# Patient Record
Sex: Female | Born: 1950 | Race: White | Hispanic: No | Marital: Married | Smoking: Never smoker
Health system: Southern US, Community
[De-identification: ages and names within clinical notes are randomized; demographics above are authoritative.]

## PROBLEM LIST (undated history)

## (undated) DIAGNOSIS — F988 Other specified behavioral and emotional disorders with onset usually occurring in childhood and adolescence: Secondary | ICD-10-CM

## (undated) DIAGNOSIS — G4730 Sleep apnea, unspecified: Secondary | ICD-10-CM

## (undated) DIAGNOSIS — M5030 Other cervical disc degeneration, unspecified cervical region: Secondary | ICD-10-CM

## (undated) DIAGNOSIS — R0600 Dyspnea, unspecified: Secondary | ICD-10-CM

## (undated) DIAGNOSIS — Z973 Presence of spectacles and contact lenses: Secondary | ICD-10-CM

## (undated) DIAGNOSIS — F32A Depression, unspecified: Secondary | ICD-10-CM

## (undated) DIAGNOSIS — M1990 Unspecified osteoarthritis, unspecified site: Secondary | ICD-10-CM

## (undated) DIAGNOSIS — F419 Anxiety disorder, unspecified: Secondary | ICD-10-CM

## (undated) DIAGNOSIS — E785 Hyperlipidemia, unspecified: Secondary | ICD-10-CM

## (undated) DIAGNOSIS — G4700 Insomnia, unspecified: Secondary | ICD-10-CM

## (undated) DIAGNOSIS — I10 Essential (primary) hypertension: Secondary | ICD-10-CM

---

## 2005-01-18 ENCOUNTER — Ambulatory Visit: Payer: Self-pay | Admitting: Family Medicine

## 2005-08-17 ENCOUNTER — Ambulatory Visit: Payer: Self-pay | Admitting: Unknown Physician Specialty

## 2006-04-08 ENCOUNTER — Ambulatory Visit: Payer: Self-pay | Admitting: Unknown Physician Specialty

## 2007-06-09 ENCOUNTER — Ambulatory Visit: Payer: Self-pay | Admitting: Unknown Physician Specialty

## 2010-04-21 ENCOUNTER — Ambulatory Visit: Payer: Self-pay | Admitting: Internal Medicine

## 2010-05-13 ENCOUNTER — Ambulatory Visit: Payer: Self-pay | Admitting: Unknown Physician Specialty

## 2010-05-20 ENCOUNTER — Ambulatory Visit: Payer: Self-pay | Admitting: Unknown Physician Specialty

## 2012-08-24 ENCOUNTER — Ambulatory Visit: Payer: Self-pay | Admitting: Unknown Physician Specialty

## 2012-09-25 ENCOUNTER — Ambulatory Visit: Payer: Self-pay | Admitting: Unknown Physician Specialty

## 2012-10-31 ENCOUNTER — Ambulatory Visit: Payer: Self-pay | Admitting: Anesthesiology

## 2012-11-07 ENCOUNTER — Ambulatory Visit: Payer: Self-pay | Admitting: Anesthesiology

## 2012-11-28 ENCOUNTER — Ambulatory Visit: Payer: Self-pay | Admitting: Anesthesiology

## 2012-11-30 ENCOUNTER — Ambulatory Visit: Payer: Self-pay | Admitting: Anesthesiology

## 2013-01-30 ENCOUNTER — Ambulatory Visit: Payer: Self-pay | Admitting: Anesthesiology

## 2013-02-13 ENCOUNTER — Ambulatory Visit: Payer: Self-pay | Admitting: Anesthesiology

## 2013-06-27 ENCOUNTER — Ambulatory Visit: Payer: Self-pay | Admitting: Neurosurgery

## 2014-05-03 NOTE — H&P (Signed)
PATIENT NAME:  Kristen Shannon, Kristen Shannon MR#:  [id number] DATE OF BIRTH:  1950/10/14  DATE OF ADMISSION:  10/31/2012  CHIEF COMPLAINT: Neck pain and left shoulder pain with hand numbness and tingling.   PROCEDURE: None.   HISTORY OF PRESENT ILLNESS: Kristen Shannon is a pleasant 65 year old and female with a long-standing history of left upper extremity numbness and tingling, neck and shoulder pain. She is reporting a condition that has gradually intensified to the point where she sought medical attention. She has had a previous MRI that shows multilevel degenerative disk disease most notably at C4-5 and C5-6 levels. She has some mild nerve root impingement with some thecal course impingement as well, and subsequent symptoms affecting the left upper extremity. She describes a gnawing, aching pain in the left posterior shoulder which radiates into her neck and runs down her arms with associated numbness and tingling in all fingers. She has not developed any significant hand grasp weakness or upper extremity weakness. No problems with bowel or bladder dysfunction are noted. No problems with lower extremity strength or function are noted.   MEDICATIONS:  Her medications include clotrimazole, duloxetine, fluticasone and multivitamin.   PAST MEDICAL HISTORY:  She also has a medical history of osteoarthritis, depression, insomnia, ADD, adenomatous polyps and degenerative disk disease of the cervical region.   PAST SURGICAL HISTORY:  She has had a previous C-section.    SOCIAL HISTORY: She is a former smoker of 3 packs per day, 30 pack-years. She uses occasional alcohol.   PHYSICAL EXAMINATION:  GENERAL:  Physical examination reveals a pleasant white female in no acute distress. She is alert and oriented x 3, cooperative and compliant.  EYES:  Pupils are equally round and reactive to light. Extraocular muscles intact.  HEART: Regular rate and rhythm.  LUNGS: Clear to auscultation.  NECK:  Inspection of the neck  reveals good strength at the atlantooccipital joint with forward flexion, extension and lateral rotation. She has diminished range of motion that is significant in nature. She has difficulty going down to 30 degrees with lateral abduction toward the left shoulder or right shoulder. She has good hand grasp strength. Flexion and extension of the wrist, biceps and shoulder are intact. She has good range of motion at the glenohumeral joints.   ASSESSMENT: Cervical degenerative disk disease with radicular symptoms in the C5-6 distribution left side.   PLAN: 1.  I have talked to her about a series of cervical epidural steroid injections to see if we get her pain under control. The risks and benefits of the procedure have been reviewed with her in full detail. We are going to give her some information on this today and have her return to the clinic at the next available date for her first cervical epidural steroid injection.  2.  She can continue following up with Dr. Gerald Stabs Cruzan for physical therapy; however, has failed to gain any significant improvement with this as of this point.  3.  Continue current medications.    ____________________________ Alvina Filbert Andree Elk, MD jga:cs D: 11/01/2012 14:58:00 ET T: 11/01/2012 15:15:13 ET JOB#: [id number]  cc: Alvina Filbert. Andree Elk, MD, <Dictator> Alvina Filbert ADAMS MD ELECTRONICALLY SIGNED 11/07/2012 8:04

## 2014-05-15 ENCOUNTER — Other Ambulatory Visit: Payer: Self-pay | Admitting: Unknown Physician Specialty

## 2014-05-15 DIAGNOSIS — Z1231 Encounter for screening mammogram for malignant neoplasm of breast: Secondary | ICD-10-CM

## 2014-06-11 ENCOUNTER — Ambulatory Visit: Payer: BLUE CROSS/BLUE SHIELD | Attending: Unknown Physician Specialty

## 2015-06-18 ENCOUNTER — Emergency Department
Admission: EM | Admit: 2015-06-18 | Discharge: 2015-06-18 | Disposition: A | Discharge status: Home / Self Care | Payer: BLUE CROSS/BLUE SHIELD | Attending: Emergency Medicine | Admitting: Emergency Medicine

## 2015-06-18 ENCOUNTER — Emergency Department: Payer: BLUE CROSS/BLUE SHIELD

## 2015-06-18 DIAGNOSIS — M25552 Pain in left hip: Secondary | ICD-10-CM | POA: Insufficient documentation

## 2015-06-18 HISTORY — DX: Anxiety disorder, unspecified: F41.9

## 2015-06-18 MED ORDER — MELOXICAM 15 MG PO TABS: 15.0000 mg | ORAL_TABLET | Freq: Every day | ORAL | Status: DC

## 2015-06-18 NOTE — Discharge Instructions (Signed)

## 2015-06-18 NOTE — ED Notes (Signed)
Pt arrives to ER via POV c/o left hip pain X "months". Pt states she was outside doing yardwork today and felt a pop to left hip, increased pain since. Pt states limp at baseline from broken toe on left foot. Pt alert and oriented X4, active, cooperative, pt in NAD. RR even and unlabored, color WNL.  Pt ambulatory.

## 2015-06-18 NOTE — ED Provider Notes (Signed)
[facility] Emergency Department Provider Note  ____________________________________________  Time seen: Approximately 4:05 PM  I have reviewed the triage vital signs and the nursing notes.   HISTORY  Chief Complaint Hip Pain    HPI Kristen Shannon is a 65 y.o. female who presents to emergency room complaining of left hip pain 2 months. Patient states that she has had intermittent left hip pain but no known injury for the past 2 months. Patient states that today she was doing yard work, felt a pop, and has had increased pain since this time. Patient denies falling or injuring this hip in any manner. Patient states that she called her primary care provider today to discuss symptoms and was advised to present to the emergency department. Patient states the pain is in the lateral aspect of the left hip. It is worse with movement. Patient denies any radiation to back, groin, or lower leg. No other complaints or symptoms.   Past Medical History  Diagnosis Date  . Anxiety     There are no active problems to display for this patient.   History reviewed. No pertinent past surgical history.  Current Outpatient Rx  Name  Route  Sig  Dispense  Refill  . meloxicam (MOBIC) 15 MG tablet   Oral   Take 1 tablet (15 mg total) by mouth daily.   30 tablet   0     Allergies Review of patient's allergies indicates no known allergies.  No family history on file.  Social History Social History  Substance Use Topics  . Smoking status: Never Smoker   . Smokeless tobacco: None  . Alcohol Use: Yes     Review of Systems  Constitutional: No fever/chills Cardiovascular: no chest pain. Respiratory: no cough. No SOB. Gastrointestinal: No abdominal pain.  No nausea, no vomiting.  No diarrhea.  No constipation. Genitourinary: Negative for dysuria. No hematuria Musculoskeletal: Positive for left hip pain. Skin: Negative for rash, abrasions, lacerations,  ecchymosis. Neurological: Negative for headaches, focal weakness or numbness. 10-point ROS otherwise negative.  ____________________________________________   PHYSICAL EXAM:  VITAL SIGNS: ED Triage Vitals  Enc Vitals Group     BP 06/18/15 1546 156/72 mmHg     Pulse Rate 06/18/15 1546 88     Resp --      Temp 06/18/15 1546 98 F (36.7 C)     Temp Source 06/18/15 1546 Oral     SpO2 06/18/15 1546 100 %     Weight 06/18/15 1546 174 lb (78.926 kg)     Height 06/18/15 1546 5\' 6"  (1.676 m)     Head Cir --      Peak Flow --      Pain Score 06/18/15 1546 10     Pain Loc --      Pain Edu? --      Excl. in [place]? --      Constitutional: Alert and oriented. Well appearing and in no acute distress. Eyes: Conjunctivae are normal. PERRL. EOMI. Head: Atraumatic. Neck: No stridor.    Cardiovascular: Normal rate, regular rhythm. Normal S1 and S2.  Good peripheral circulation. Respiratory: Normal respiratory effort without tachypnea or retractions. Lungs CTAB. Good air entry to the bases with no decreased or absent breath sounds. Gastrointestinal: Bowel sounds 4 quadrants. Soft and nontender to palpation. No guarding or rigidity. No palpable masses. No distention. No CVA tenderness. Musculoskeletal: No deformity to left hip. Patient has good range of motion. Passive range of motion does elicit tenderness  with full flexion. Patient is not tender to palpation over greater trochanter. No palpable abnormality. Sensation and pulses intact distally. Neurologic:  Normal speech and language. No gross focal neurologic deficits are appreciated.  Skin:  Skin is warm, dry and intact. No rash noted. Psychiatric: Mood and affect are normal. Speech and behavior are normal. Patient exhibits appropriate insight and judgement.   ____________________________________________   LABS (all labs ordered are listed, but only abnormal results are displayed)  Labs Reviewed - No data to  display ____________________________________________  EKG   ____________________________________________  RADIOLOGY Diamantina Providence Jaella Weinert, personally viewed and evaluated these images (plain radiographs) as part of my medical decision making, as well as reviewing the written report by the radiologist.  Dg Hip Unilat With Pelvis 2-3 Views Left  06/18/2015  CLINICAL DATA:  65 year old female with left hip pain for 2 months with no specific injury. Initial encounter. EXAM: DG HIP (WITH OR WITHOUT PELVIS) 2-3V LEFT COMPARISON:  None. FINDINGS: Both femoral heads are normally located. Hip joint spaces appear relatively symmetric and normal for age. The dominant degenerative finding is mild acetabular spurring bilaterally. Pelvis intact. Sacral ala and SI joints appear normal. Proximal right femur appears intact. Proximal left femur intact. IMPRESSION: No acute osseous abnormality identified about the left hip or pelvis. Electronically Signed   By: Genevie Ann M.D.   On: 06/18/2015 16:27    ____________________________________________    PROCEDURES  Procedure(s) performed:       Medications - No data to display   ____________________________________________   INITIAL IMPRESSION / ASSESSMENT AND PLAN / ED COURSE  Pertinent labs & imaging results that were available during my care of the patient were reviewed by me and considered in my medical decision making (see chart for details).  Patient's diagnosis is consistent with left hip pain. X-rays reveal no acute osseous abnormality. Exam is greatly reassuring. Patient reports a "tightness/burning sensation over the musculature. It is not reproducible to palpation. Diagnosis is left hip pain with no specific underlying etiology. Patient's symptoms and location are consistent with either IT band syndrome or bursitis but exam does not reveal tenderness to palpation over either region. As such, patient will be given anti-inflammatories for  symptom control and advised to follow-up with orthopedics should symptoms persist past treatment with NSAIDs..  Patient is given ED precautions to return to the ED for any worsening or new symptoms.     ____________________________________________  FINAL CLINICAL IMPRESSION(S) / ED DIAGNOSES  Final diagnoses:  Hip pain, left      NEW MEDICATIONS STARTED DURING THIS VISIT:  Discharge Medication List as of 06/18/2015  5:41 PM    START taking these medications   Details  meloxicam (MOBIC) 15 MG tablet Take 1 tablet (15 mg total) by mouth daily., Starting 06/18/2015, Until Discontinued, Print            This chart was dictated using voice recognition software/Dragon. Despite best efforts to proofread, errors can occur which can change the meaning. Any change was purely unintentional.    Darletta Moll, PA-C 06/18/15 1839  Lisa Roca, MD 06/18/15 2248

## 2015-08-11 ENCOUNTER — Other Ambulatory Visit: Payer: Self-pay | Admitting: Unknown Physician Specialty

## 2015-08-11 DIAGNOSIS — N9489 Other specified conditions associated with female genital organs and menstrual cycle: Secondary | ICD-10-CM

## 2015-08-11 DIAGNOSIS — R9389 Abnormal findings on diagnostic imaging of other specified body structures: Secondary | ICD-10-CM

## 2015-08-21 ENCOUNTER — Ambulatory Visit
Admission: RE | Admit: 2015-08-21 | Discharge: 2015-08-21 | Disposition: A | Discharge status: Home / Self Care | Payer: BLUE CROSS/BLUE SHIELD | Source: Ambulatory Visit | Attending: Unknown Physician Specialty | Admitting: Unknown Physician Specialty

## 2015-08-21 ENCOUNTER — Other Ambulatory Visit
Admission: RE | Admit: 2015-08-21 | Discharge: 2015-08-21 | Disposition: A | Discharge status: Home / Self Care | Payer: BLUE CROSS/BLUE SHIELD | Source: Ambulatory Visit | Attending: Unknown Physician Specialty | Admitting: Unknown Physician Specialty

## 2015-08-21 DIAGNOSIS — R9389 Abnormal findings on diagnostic imaging of other specified body structures: Secondary | ICD-10-CM

## 2015-08-21 DIAGNOSIS — N9489 Other specified conditions associated with female genital organs and menstrual cycle: Secondary | ICD-10-CM

## 2015-08-21 DIAGNOSIS — Z01812 Encounter for preprocedural laboratory examination: Secondary | ICD-10-CM | POA: Diagnosis not present

## 2015-08-21 DIAGNOSIS — R938 Abnormal findings on diagnostic imaging of other specified body structures: Secondary | ICD-10-CM | POA: Diagnosis not present

## 2015-08-21 LAB — CREATININE, SERUM
CREATININE: 0.61 mg/dL (ref 0.44–1.00)
GFR calc Af Amer: >60 mL/min (ref >60)
GFR calc non Af Amer: >60 mL/min (ref >60)

## 2015-08-21 MED ORDER — GADOBENATE DIMEGLUMINE 529 MG/ML IV SOLN
16.0000 mL | Freq: Once | INTRAVENOUS | Status: AC | PRN
  Administered 2015-08-21: 16 mL via INTRAVENOUS

## 2015-10-27 ENCOUNTER — Other Ambulatory Visit: Payer: Self-pay | Admitting: Unknown Physician Specialty

## 2015-10-27 DIAGNOSIS — Z1231 Encounter for screening mammogram for malignant neoplasm of breast: Secondary | ICD-10-CM

## 2015-10-31 ENCOUNTER — Ambulatory Visit
Admission: RE | Admit: 2015-10-31 | Discharge: 2015-10-31 | Disposition: A | Discharge status: Home / Self Care | Payer: BLUE CROSS/BLUE SHIELD | Source: Ambulatory Visit | Attending: Unknown Physician Specialty | Admitting: Unknown Physician Specialty

## 2015-10-31 DIAGNOSIS — Z1231 Encounter for screening mammogram for malignant neoplasm of breast: Secondary | ICD-10-CM | POA: Diagnosis not present

## 2017-06-05 IMAGING — MG MM DIGITAL SCREENING BILAT W/ CAD
5 series · 5 of 5 positions shown · non-contrast
Comparison: Previous exam(s).

CLINICAL DATA: Screening.

EXAM:
DIGITAL SCREENING BILATERAL MAMMOGRAM WITH CAD

[R CC]
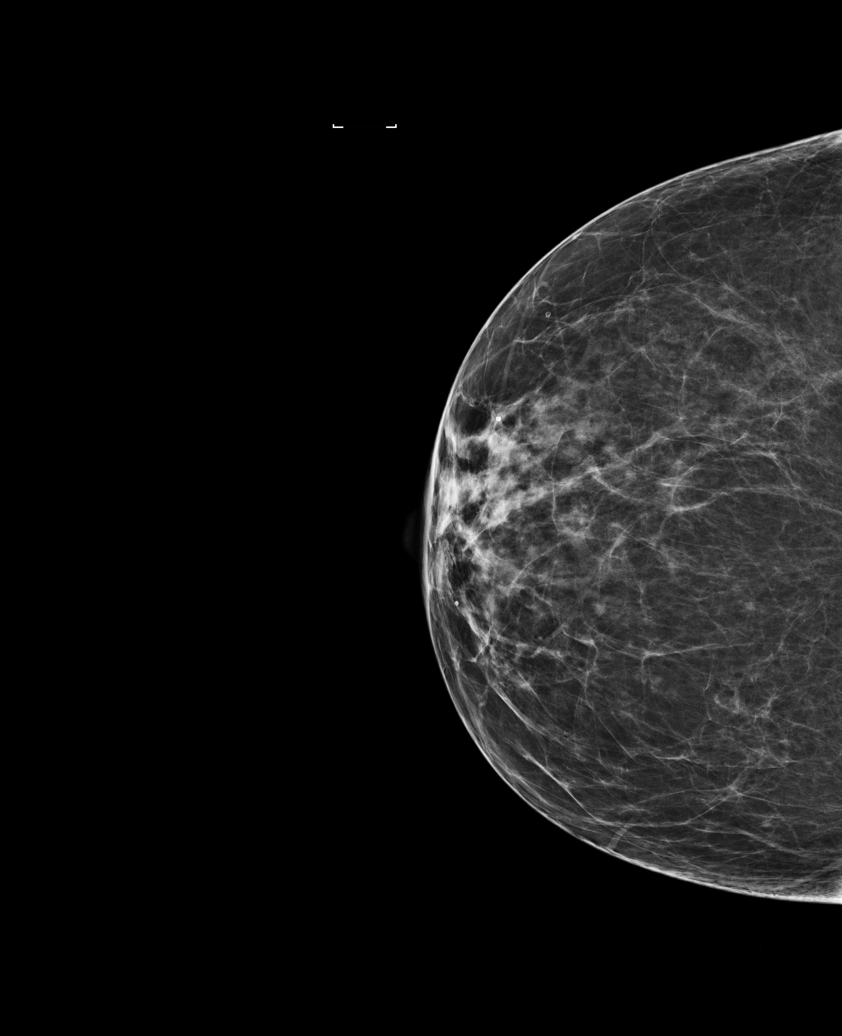

[L MLO (1 of 2)]
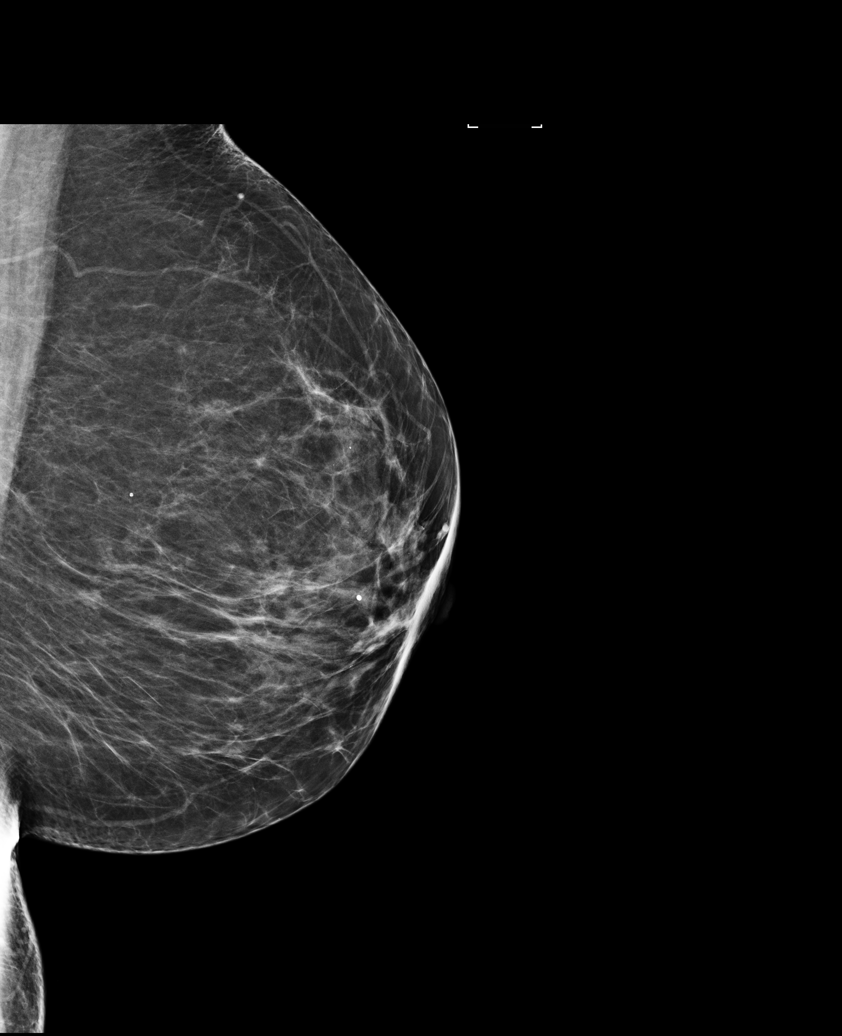

[L MLO (2 of 2)]
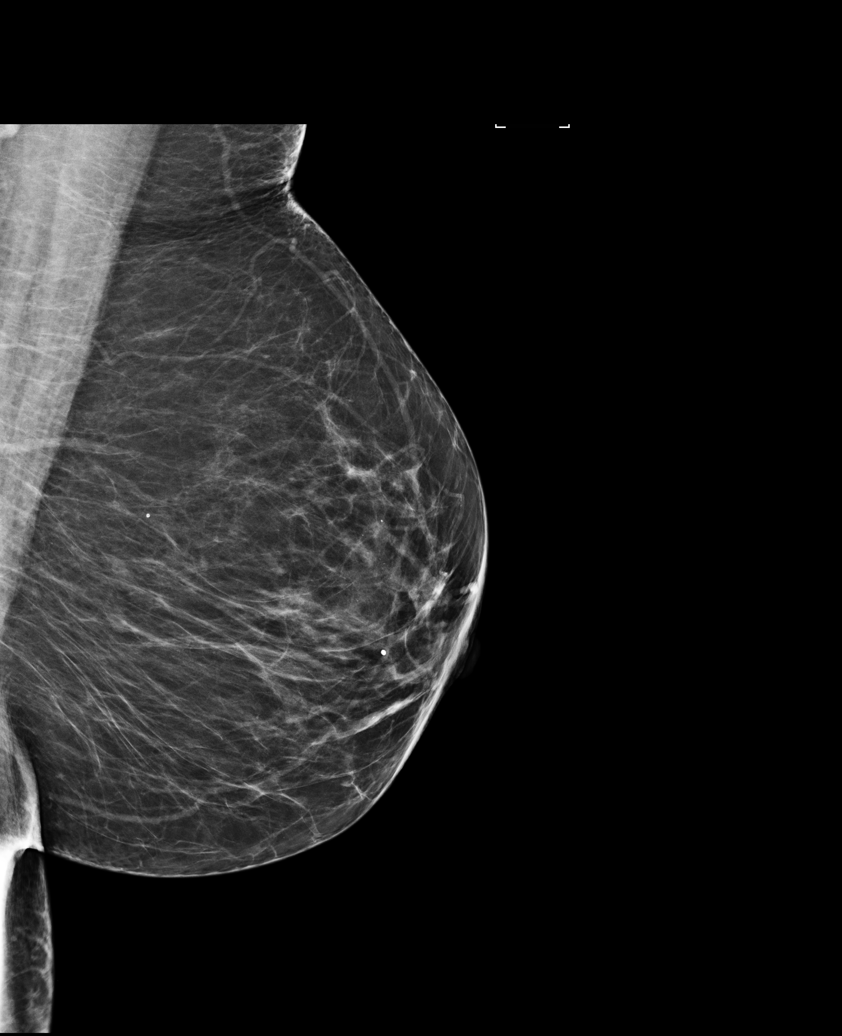

[R MLO]
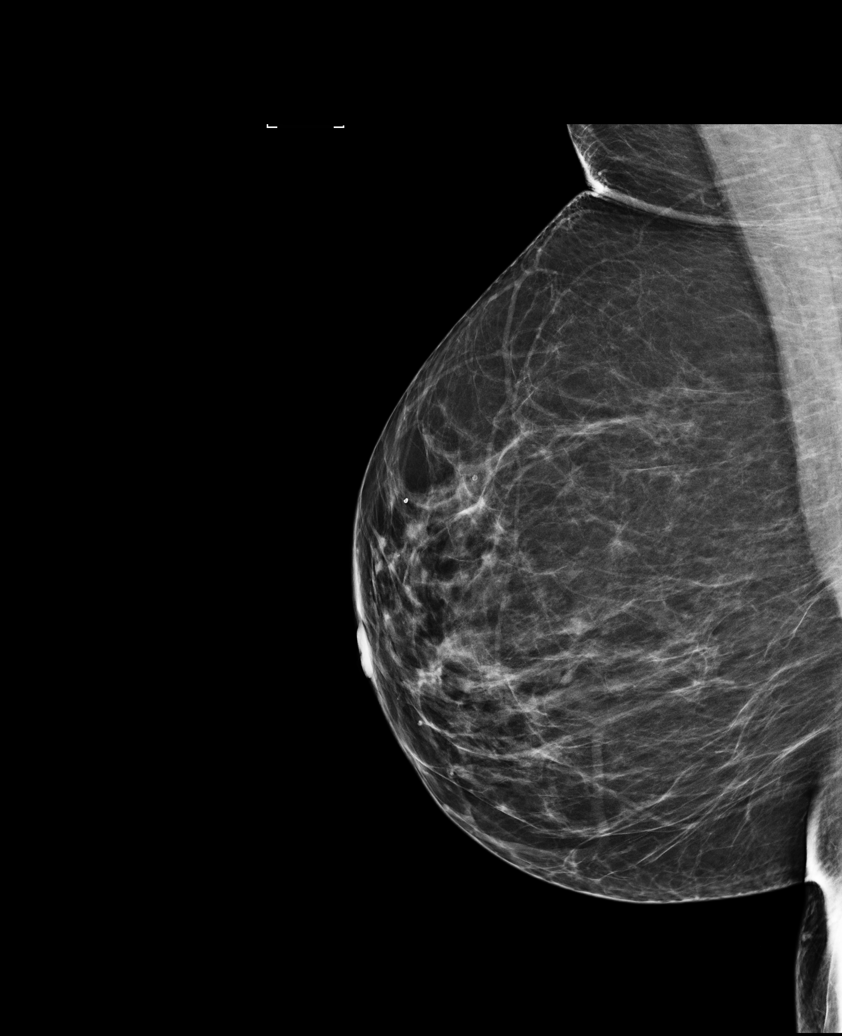

[L CC]
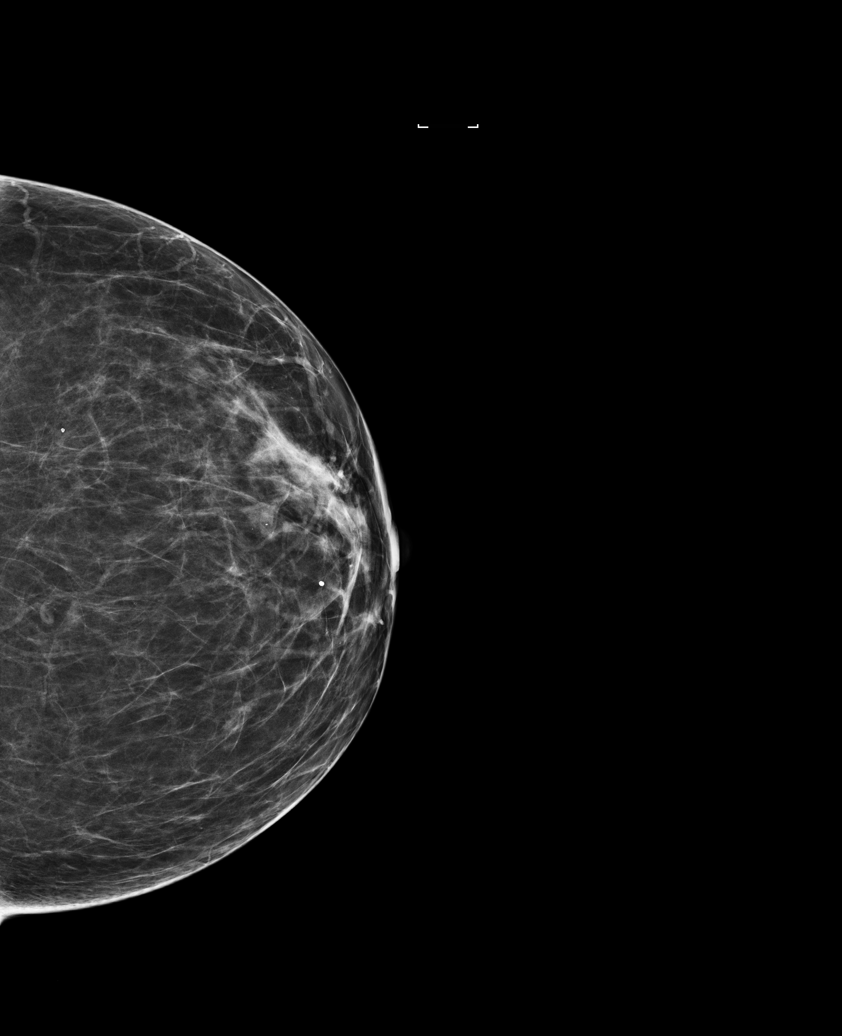

[5 of 5 positions shown; findings below may reference images not displayed]

ACR Breast Density Category b: There are scattered areas of
fibroglandular density.
FINDINGS: There are no findings suspicious for malignancy. Images were
processed with CAD.
IMPRESSION: No mammographic evidence of malignancy. A result letter of this
screening mammogram will be mailed directly to the patient.

RECOMMENDATION:
Screening mammogram in one year. (Code:AS-G-LCT)

BI-RADS CATEGORY  1: Negative.

## 2018-02-16 ENCOUNTER — Other Ambulatory Visit: Payer: Self-pay | Admitting: Physician Assistant

## 2018-02-16 DIAGNOSIS — M542 Cervicalgia: Secondary | ICD-10-CM

## 2018-02-16 DIAGNOSIS — Z981 Arthrodesis status: Secondary | ICD-10-CM

## 2018-02-17 ENCOUNTER — Other Ambulatory Visit: Payer: Self-pay | Admitting: Physician Assistant

## 2018-02-17 DIAGNOSIS — M542 Cervicalgia: Secondary | ICD-10-CM

## 2018-02-17 DIAGNOSIS — G5603 Carpal tunnel syndrome, bilateral upper limbs: Secondary | ICD-10-CM

## 2018-02-17 DIAGNOSIS — Z981 Arthrodesis status: Secondary | ICD-10-CM

## 2018-02-28 ENCOUNTER — Ambulatory Visit
Admission: RE | Admit: 2018-02-28 | Discharge: 2018-02-28 | Disposition: A | Discharge status: Home / Self Care | Payer: BLUE CROSS/BLUE SHIELD | Source: Ambulatory Visit | Attending: Physician Assistant | Admitting: Physician Assistant

## 2018-02-28 DIAGNOSIS — M542 Cervicalgia: Secondary | ICD-10-CM | POA: Diagnosis present

## 2018-02-28 DIAGNOSIS — Z981 Arthrodesis status: Secondary | ICD-10-CM | POA: Diagnosis present

## 2018-02-28 DIAGNOSIS — G5603 Carpal tunnel syndrome, bilateral upper limbs: Secondary | ICD-10-CM | POA: Diagnosis present

## 2018-07-04 ENCOUNTER — Other Ambulatory Visit: Payer: Self-pay | Admitting: Unknown Physician Specialty

## 2018-07-04 DIAGNOSIS — Z1231 Encounter for screening mammogram for malignant neoplasm of breast: Secondary | ICD-10-CM

## 2018-07-24 ENCOUNTER — Ambulatory Visit
Admission: RE | Admit: 2018-07-24 | Discharge: 2018-07-24 | Disposition: A | Discharge status: Home / Self Care | Payer: Medicare Other | Source: Ambulatory Visit | Attending: Unknown Physician Specialty | Admitting: Unknown Physician Specialty

## 2018-07-24 ENCOUNTER — Other Ambulatory Visit: Payer: Self-pay

## 2018-07-24 DIAGNOSIS — Z1231 Encounter for screening mammogram for malignant neoplasm of breast: Secondary | ICD-10-CM | POA: Diagnosis present

## 2018-07-26 ENCOUNTER — Other Ambulatory Visit: Payer: Self-pay | Admitting: Unknown Physician Specialty

## 2018-08-02 ENCOUNTER — Other Ambulatory Visit: Payer: Self-pay | Admitting: Unknown Physician Specialty

## 2018-08-02 DIAGNOSIS — N6489 Other specified disorders of breast: Secondary | ICD-10-CM

## 2018-08-02 DIAGNOSIS — N6320 Unspecified lump in the left breast, unspecified quadrant: Secondary | ICD-10-CM

## 2018-08-02 DIAGNOSIS — R928 Other abnormal and inconclusive findings on diagnostic imaging of breast: Secondary | ICD-10-CM

## 2018-08-09 ENCOUNTER — Other Ambulatory Visit: Payer: Medicare Other

## 2018-08-09 ENCOUNTER — Ambulatory Visit: Payer: Medicare Other

## 2018-08-16 ENCOUNTER — Other Ambulatory Visit: Payer: Medicare Other

## 2018-08-25 ENCOUNTER — Other Ambulatory Visit: Payer: Medicare Other

## 2018-08-25 ENCOUNTER — Ambulatory Visit: Payer: Medicare HMO

## 2018-09-08 ENCOUNTER — Ambulatory Visit
Admission: RE | Admit: 2018-09-08 | Discharge: 2018-09-08 | Disposition: A | Discharge status: Home / Self Care | Payer: Medicare HMO | Source: Ambulatory Visit | Attending: Unknown Physician Specialty | Admitting: Unknown Physician Specialty

## 2018-09-08 DIAGNOSIS — N6320 Unspecified lump in the left breast, unspecified quadrant: Secondary | ICD-10-CM | POA: Insufficient documentation

## 2018-09-08 DIAGNOSIS — N6489 Other specified disorders of breast: Secondary | ICD-10-CM

## 2018-09-08 DIAGNOSIS — R928 Other abnormal and inconclusive findings on diagnostic imaging of breast: Secondary | ICD-10-CM | POA: Diagnosis present

## 2018-09-14 ENCOUNTER — Other Ambulatory Visit: Payer: Self-pay | Admitting: Unknown Physician Specialty

## 2018-09-14 DIAGNOSIS — N6320 Unspecified lump in the left breast, unspecified quadrant: Secondary | ICD-10-CM

## 2018-09-14 DIAGNOSIS — N6310 Unspecified lump in the right breast, unspecified quadrant: Secondary | ICD-10-CM

## 2018-09-14 DIAGNOSIS — R928 Other abnormal and inconclusive findings on diagnostic imaging of breast: Secondary | ICD-10-CM

## 2018-10-05 ENCOUNTER — Other Ambulatory Visit: Payer: Self-pay | Admitting: Unknown Physician Specialty

## 2018-10-05 DIAGNOSIS — Z780 Asymptomatic menopausal state: Secondary | ICD-10-CM

## 2019-06-01 ENCOUNTER — Other Ambulatory Visit: Payer: Self-pay | Admitting: Cardiology

## 2020-05-21 ENCOUNTER — Other Ambulatory Visit: Payer: Self-pay | Admitting: Podiatry

## 2020-05-21 DIAGNOSIS — M7741 Metatarsalgia, right foot: Secondary | ICD-10-CM

## 2020-05-21 DIAGNOSIS — G5761 Lesion of plantar nerve, right lower limb: Secondary | ICD-10-CM

## 2020-06-03 ENCOUNTER — Other Ambulatory Visit: Payer: Self-pay

## 2020-06-03 ENCOUNTER — Ambulatory Visit
Admission: RE | Admit: 2020-06-03 | Discharge: 2020-06-03 | Disposition: A | Discharge status: Home / Self Care | Payer: Medicare HMO | Source: Ambulatory Visit | Attending: Podiatry | Admitting: Podiatry

## 2020-06-03 DIAGNOSIS — G5761 Lesion of plantar nerve, right lower limb: Secondary | ICD-10-CM | POA: Insufficient documentation

## 2020-06-03 DIAGNOSIS — M7741 Metatarsalgia, right foot: Secondary | ICD-10-CM

## 2020-08-07 ENCOUNTER — Other Ambulatory Visit: Payer: Self-pay | Admitting: Podiatry

## 2020-08-11 ENCOUNTER — Encounter: Payer: Self-pay | Admitting: Podiatry

## 2020-08-18 NOTE — Discharge Instructions (Signed)
[facility]  POST OPERATIVE INSTRUCTIONS FOR DR. FOWLER AND [facility] PODIATRY DEPART[facility]   Take your medication as prescribed.  Pain medication should be taken only as needed.  Keep the dressing clean, dry and intact.  Keep your foot elevated above the heart level for the first 48 hours.  Walking to the bathroom and brief periods of walking are acceptable, unless we have instructed you to be non-weight bearing.  Always wear your post-op shoe when walking.  Always use your crutches if you are to be non-weight bearing.  Do not take a shower. Baths are permissible as long as the foot is kept out of the water.   Every hour you are awake:  Bend your knee 15 times. Flex foot 15 times Massage calf 15 times  Call [facility] ([phone]) if any of the following problems occur: You develop a temperature or fever. The bandage becomes saturated with blood. Medication does not stop your pain. Injury of the foot occurs. Any symptoms of infection including redness, odor, or red streaks running from wound. 

## 2020-08-19 NOTE — Anesthesia Preprocedure Evaluation (Addendum)
Anesthesia Evaluation  Patient identified by MRN, date of birth, ID band Patient awake    Reviewed: Allergy & Precautions, NPO status , Patient's Chart, lab work & pertinent test results  History of Anesthesia Complications Negative for: history of anesthetic complications  Airway Mallampati: III   Neck ROM: Full    Dental   Lower right bridge:   Pulmonary sleep apnea , former smoker (quit 31 years ago),    Pulmonary exam normal breath sounds clear to auscultation       Cardiovascular hypertension, Normal cardiovascular exam Rhythm:Regular Rate:Normal     Neuro/Psych PSYCHIATRIC DISORDERS (ADHD) Anxiety Depression negative neurological ROS     GI/Hepatic negative GI ROS,   Endo/Other  Obesity   Renal/GU negative Renal ROS     Musculoskeletal  (+) Arthritis ,   Abdominal   Peds  Hematology negative hematology ROS (+)   Anesthesia Other Findings   Reproductive/Obstetrics                            Anesthesia Physical Anesthesia Plan  ASA: 2  Anesthesia Plan: General   Post-op Pain Management:    Induction: Intravenous  PONV Risk Score and Plan: 3 and Ondansetron, Dexamethasone and Treatment may vary due to age or medical condition  Airway Management Planned: LMA  Additional Equipment:   Intra-op Plan:   Post-operative Plan: Extubation in OR  Informed Consent: I have reviewed the patients History and Physical, chart, labs and discussed the procedure including the risks, benefits and alternatives for the proposed anesthesia with the patient or authorized representative who has indicated his/her understanding and acceptance.       Plan Discussed with: CRNA  Anesthesia Plan Comments:        Anesthesia Quick Evaluation

## 2020-08-20 ENCOUNTER — Other Ambulatory Visit: Payer: Self-pay

## 2020-08-20 ENCOUNTER — Encounter: Payer: Self-pay | Admitting: Podiatry

## 2020-08-20 ENCOUNTER — Encounter
Admission: RE | Disposition: A | Discharge status: Home / Self Care | Payer: Self-pay | Source: Home / Self Care | Attending: Podiatry

## 2020-08-20 ENCOUNTER — Ambulatory Visit: Payer: Medicare HMO | Admitting: Anesthesiology

## 2020-08-20 ENCOUNTER — Ambulatory Visit
Admission: RE | Admit: 2020-08-20 | Discharge: 2020-08-20 | Disposition: A | Discharge status: Home / Self Care | Payer: Medicare HMO | Attending: Podiatry | Admitting: Podiatry

## 2020-08-20 DIAGNOSIS — Z79899 Other long term (current) drug therapy: Secondary | ICD-10-CM | POA: Diagnosis not present

## 2020-08-20 DIAGNOSIS — Z87891 Personal history of nicotine dependence: Secondary | ICD-10-CM | POA: Insufficient documentation

## 2020-08-20 DIAGNOSIS — Z7982 Long term (current) use of aspirin: Secondary | ICD-10-CM | POA: Insufficient documentation

## 2020-08-20 DIAGNOSIS — M65871 Other synovitis and tenosynovitis, right ankle and foot: Secondary | ICD-10-CM | POA: Diagnosis not present

## 2020-08-20 DIAGNOSIS — G5761 Lesion of plantar nerve, right lower limb: Secondary | ICD-10-CM | POA: Diagnosis present

## 2020-08-20 HISTORY — DX: Sleep apnea, unspecified: G47.30

## 2020-08-20 HISTORY — DX: Insomnia, unspecified: G47.00

## 2020-08-20 HISTORY — DX: Other cervical disc degeneration, unspecified cervical region: M50.30

## 2020-08-20 HISTORY — DX: Depression, unspecified: F32.A

## 2020-08-20 HISTORY — DX: Dyspnea, unspecified: R06.00

## 2020-08-20 HISTORY — DX: Essential (primary) hypertension: I10

## 2020-08-20 HISTORY — DX: Unspecified osteoarthritis, unspecified site: M19.90

## 2020-08-20 HISTORY — DX: Other specified behavioral and emotional disorders with onset usually occurring in childhood and adolescence: F98.8

## 2020-08-20 HISTORY — PX: EXCISION MORTON'S NEUROMA: SHX5013

## 2020-08-20 SURGERY — EXCISION, MORTON'S NEUROMA
Anesthesia: General | Site: Foot | Laterality: Right

## 2020-08-20 MED ORDER — SODIUM CHLORIDE 0.9 % IR SOLN
Status: DC | PRN
  Administered 2020-08-20: 500 mL

## 2020-08-20 MED ORDER — LIDOCAINE HCL (CARDIAC) PF 100 MG/5ML IV SOSY
PREFILLED_SYRINGE | INTRAVENOUS | Status: DC | PRN
  Administered 2020-08-20: 50 mg via INTRATRACHEAL

## 2020-08-20 MED ORDER — CEFAZOLIN SODIUM-DEXTROSE 2-4 GM/100ML-% IV SOLN
2.0000 g | INTRAVENOUS | Status: AC
  Administered 2020-08-20: 2 g via INTRAVENOUS

## 2020-08-20 MED ORDER — PROPOFOL 10 MG/ML IV BOLUS
INTRAVENOUS | Status: DC | PRN
  Administered 2020-08-20: 120 mg via INTRAVENOUS

## 2020-08-20 MED ORDER — ACETAMINOPHEN 10 MG/ML IV SOLN: 1000.0000 mg | Freq: Once | INTRAVENOUS | Status: DC | PRN

## 2020-08-20 MED ORDER — ONDANSETRON HCL 4 MG/2ML IJ SOLN: 4.0000 mg | Freq: Once | INTRAMUSCULAR | Status: DC | PRN

## 2020-08-20 MED ORDER — LACTATED RINGERS IV SOLN: INTRAVENOUS | Status: DC

## 2020-08-20 MED ORDER — ALBUTEROL SULFATE HFA 108 (90 BASE) MCG/ACT IN AERS
INHALATION_SPRAY | RESPIRATORY_TRACT | Status: DC | PRN
  Administered 2020-08-20: 4 via RESPIRATORY_TRACT

## 2020-08-20 MED ORDER — EPHEDRINE SULFATE 50 MG/ML IJ SOLN
INTRAMUSCULAR | Status: DC | PRN
  Administered 2020-08-20 (×3): 10 mg via INTRAVENOUS

## 2020-08-20 MED ORDER — ONDANSETRON HCL 4 MG/2ML IJ SOLN: 4.0000 mg | Freq: Four times a day (QID) | INTRAMUSCULAR | Status: DC | PRN

## 2020-08-20 MED ORDER — GLYCOPYRROLATE 0.2 MG/ML IJ SOLN
INTRAMUSCULAR | Status: DC | PRN
  Administered 2020-08-20: .1 mg via INTRAVENOUS

## 2020-08-20 MED ORDER — OXYCODONE HCL 5 MG PO TABS: 5.0000 mg | ORAL_TABLET | Freq: Once | ORAL | Status: DC | PRN

## 2020-08-20 MED ORDER — OXYCODONE HCL 5 MG/5ML PO SOLN
5.0000 mg | Freq: Once | ORAL | Status: DC | PRN
Start: 2020-08-20 — End: 2020-08-20

## 2020-08-20 MED ORDER — METOCLOPRAMIDE HCL 5 MG/ML IJ SOLN: 5.0000 mg | Freq: Three times a day (TID) | INTRAMUSCULAR | Status: DC | PRN

## 2020-08-20 MED ORDER — FENTANYL CITRATE PF 50 MCG/ML IJ SOSY: 25.0000 ug | PREFILLED_SYRINGE | INTRAMUSCULAR | Status: DC | PRN

## 2020-08-20 MED ORDER — MIDAZOLAM HCL 5 MG/5ML IJ SOLN
INTRAMUSCULAR | Status: DC | PRN
  Administered 2020-08-20: 2 mg via INTRAVENOUS

## 2020-08-20 MED ORDER — ONDANSETRON HCL 4 MG PO TABS: 4.0000 mg | ORAL_TABLET | Freq: Four times a day (QID) | ORAL | Status: DC | PRN

## 2020-08-20 MED ORDER — LIDOCAINE-EPINEPHRINE 1 %-1:100000 IJ SOLN
INTRAMUSCULAR | Status: DC | PRN
  Administered 2020-08-20: 4 mL via INTRADERMAL

## 2020-08-20 MED ORDER — POVIDONE-IODINE 7.5 % EX SOLN: Freq: Once | CUTANEOUS | Status: AC

## 2020-08-20 MED ORDER — FENTANYL CITRATE (PF) 100 MCG/2ML IJ SOLN
INTRAMUSCULAR | Status: DC | PRN
  Administered 2020-08-20: 50 ug via INTRAVENOUS

## 2020-08-20 MED ORDER — DEXAMETHASONE SODIUM PHOSPHATE 4 MG/ML IJ SOLN
INTRAMUSCULAR | Status: DC | PRN
  Administered 2020-08-20: 4 mg via INTRAVENOUS

## 2020-08-20 MED ORDER — BUPIVACAINE HCL (PF) 0.5 % IJ SOLN
INTRAMUSCULAR | Status: DC | PRN
  Administered 2020-08-20: 4 mL

## 2020-08-20 MED ORDER — OXYCODONE-ACETAMINOPHEN 5-325 MG PO TABS: 1.0000 | ORAL_TABLET | Freq: Four times a day (QID) | ORAL | 0 refills | Status: AC | PRN

## 2020-08-20 MED ORDER — METOCLOPRAMIDE HCL 5 MG PO TABS: 5.0000 mg | ORAL_TABLET | Freq: Three times a day (TID) | ORAL | Status: DC | PRN

## 2020-08-20 SURGICAL SUPPLY — 25 items
BENZOIN TINCTURE PRP APPL 2/3 (GAUZE/BANDAGES/DRESSINGS) ×2 IMPLANT
CANISTER SUCT 1200ML W/VALVE (MISCELLANEOUS) ×2 IMPLANT
COVER LIGHT HANDLE UNIVERSAL (MISCELLANEOUS) ×2 IMPLANT
DURAPREP 26ML APPLICATOR (WOUND CARE) ×2 IMPLANT
ELECT REM PT RETURN 9FT ADLT (ELECTROSURGICAL) ×2
ELECTRODE REM PT RTRN 9FT ADLT (ELECTROSURGICAL) ×1 IMPLANT
GAUZE SPONGE 4X4 12PLY STRL (GAUZE/BANDAGES/DRESSINGS) ×2 IMPLANT
GAUZE XEROFORM 1X8 LF (GAUZE/BANDAGES/DRESSINGS) ×2 IMPLANT
GLOVE SURG ENC MOIS LTX SZ7.5 (GLOVE) ×2 IMPLANT
GOWN STRL REUS W/ TWL LRG LVL3 (GOWN DISPOSABLE) ×2 IMPLANT
GOWN STRL REUS W/TWL LRG LVL3 (GOWN DISPOSABLE) ×2
NDL HYPO 25GX1X1/2 BEV (NEEDLE) ×2 IMPLANT
NEEDLE HYPO 25GX1X1/2 BEV (NEEDLE) ×4 IMPLANT
NS IRRIG 500ML POUR BTL (IV SOLUTION) ×2 IMPLANT
PACK EXTREMITY ARMC (MISCELLANEOUS) ×2 IMPLANT
PENCIL SMOKE EVACUATOR (MISCELLANEOUS) ×2 IMPLANT
STOCKINETTE IMPERVIOUS LG (DRAPES) ×2 IMPLANT
STRAP BODY AND KNEE 60X3 (MISCELLANEOUS) ×2 IMPLANT
STRIP CLOSURE SKIN 1/4X4 (GAUZE/BANDAGES/DRESSINGS) ×2 IMPLANT
SUT MNCRL 4-0 (SUTURE) ×1
SUT MNCRL 4-0 27XMFL (SUTURE) ×1
SUT VIC AB 4-0 RB1 27 (SUTURE) ×1
SUT VIC AB 4-0 RB1 27X BRD (SUTURE) ×1 IMPLANT
SUTURE MNCRL 4-0 27XMF (SUTURE) IMPLANT
SYR 10ML LL (SYRINGE) ×2 IMPLANT

## 2020-08-20 NOTE — Anesthesia Postprocedure Evaluation (Signed)
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  Darrin Nipper

## 2020-08-20 NOTE — H&P (Signed)
HISTORY AND PHYSICAL INTERVAL NOTE:  08/20/2020  11:56 AM  Kristen Shannon  has presented today for surgery, with the diagnosis of G57.61 - Lesion of right plantar nerve M77.41 - Metatarsalgia of right foot M65.871 - Other synovitis and tenosynovitis, right ankle and foot.  The various methods of treatment have been discussed with the patient.  No guarantees were given.  After consideration of risks, benefits and other options for treatment, the patient has consented to surgery.  I have reviewed the patients' chart and labs.     A history and physical examination was performed in my office.  The patient was reexamined.  There have been no changes to this history and physical examination.  [name] A

## 2020-08-20 NOTE — Op Note (Signed)
Operative note   Surgeon:Brodie Correll Lawyer: None    Preop diagnosis: Neuroma right third webspace    Postop diagnosis: Same    Procedure: Excision Morton's neuroma right third webspace    EBL: Minimal    Anesthesia:local and general    Hemostasis: Epinephrine infiltrated along the incision site with lidocaine and 0.5% bupivacaine    Specimen: Morton's neuroma right third webspace    Complications: None    Operative indications:Kristen Shannon is an 70 y.o. that presents today for surgical intervention.  The risks/benefits/alternatives/complications have been discussed and consent has been given.    Procedure:  Patient was brought into the OR and placed on the operating table in thesupine position. After anesthesia was obtained theright lower extremity was prepped and draped in usual sterile fashion.  Attention was directed to the right foot where a small longitudinal incision was made from the third webspace approximately 2 cm in length.  Sharp and blunt dissection carried down to the deep transverse intermetatarsal ligament.  This was then incised.  At this time the neuroma was noted between the metatarsal heads.  The 2 distal arms to the third and fourth toes were then dissected out and transected.  This was then dissected proximal to the metatarsal phalangeal joint region at the level of the metatarsal heads and surgical neck region and the neuroma was then excised at this time.  The proximal portion was allowed to retract into the soft tissue.  The specimen was sent for pathological examination.  All bleeders were Bovie cauterized as appropriate.  The wound was flushed with copious amounts of irrigation.  Layered closure was performed with a 4-0 Vicryl the superficial tissue and a 4-0 Monocryl for the skin.  A bulky sterile dressing was then applied.    Patient tolerated the procedure and anesthesia well.  Was transported from the OR to the PACU with all vital signs stable  and vascular status intact. To be discharged per routine protocol.  Will follow up in approximately 1 week in the outpatient clinic.

## 2020-08-20 NOTE — Anesthesia Procedure Notes (Signed)
Procedure Name: LMA Insertion Date/Time: 08/20/2020 12:10 PM Performed by: Mayme Genta, CRNA Pre-anesthesia Checklist: Patient identified, Emergency Drugs available, Suction available, Timeout performed and Patient being monitored Patient Re-evaluated:Patient Re-evaluated prior to induction Oxygen Delivery Method: Circle system utilized Preoxygenation: Pre-oxygenation with 100% oxygen Induction Type: IV induction LMA: LMA inserted LMA Size: 4.0 Number of attempts: 1 Placement Confirmation: positive ETCO2 and breath sounds checked- equal and bilateral Tube secured with: Tape

## 2020-08-20 NOTE — Transfer of Care (Signed)
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

## 2020-08-21 ENCOUNTER — Encounter: Payer: Self-pay | Admitting: Podiatry

## 2020-08-22 LAB — SURGICAL PATHOLOGY

## 2020-09-05 ENCOUNTER — Other Ambulatory Visit: Payer: Self-pay | Admitting: Unknown Physician Specialty

## 2020-09-05 DIAGNOSIS — Z780 Asymptomatic menopausal state: Secondary | ICD-10-CM

## 2020-09-05 DIAGNOSIS — R928 Other abnormal and inconclusive findings on diagnostic imaging of breast: Secondary | ICD-10-CM

## 2020-11-03 ENCOUNTER — Other Ambulatory Visit: Payer: Self-pay | Admitting: Unknown Physician Specialty

## 2020-11-03 DIAGNOSIS — R928 Other abnormal and inconclusive findings on diagnostic imaging of breast: Secondary | ICD-10-CM

## 2020-11-27 ENCOUNTER — Ambulatory Visit
Admission: RE | Admit: 2020-11-27 | Discharge: 2020-11-27 | Disposition: A | Discharge status: Home / Self Care | Payer: Medicare HMO | Source: Ambulatory Visit | Attending: Unknown Physician Specialty | Admitting: Unknown Physician Specialty

## 2020-11-27 ENCOUNTER — Other Ambulatory Visit: Payer: Self-pay

## 2020-11-27 DIAGNOSIS — R928 Other abnormal and inconclusive findings on diagnostic imaging of breast: Secondary | ICD-10-CM

## 2020-11-27 DIAGNOSIS — Z780 Asymptomatic menopausal state: Secondary | ICD-10-CM | POA: Diagnosis present

## 2020-12-13 ENCOUNTER — Telehealth: Payer: Self-pay

## 2020-12-13 NOTE — Telephone Encounter (Signed)
Patient is ready to schedule procedure(before the end of the year,due to insurance change. Clinical staff will follow up with patient.

## 2020-12-15 ENCOUNTER — Other Ambulatory Visit: Payer: Self-pay | Admitting: Gastroenterology

## 2020-12-15 ENCOUNTER — Other Ambulatory Visit: Payer: Self-pay

## 2020-12-15 DIAGNOSIS — Z860100 Personal history of colon polyps, unspecified: Secondary | ICD-10-CM

## 2020-12-15 MED ORDER — NA SULFATE-K SULFATE-MG SULF 17.5-3.13-1.6 GM/177ML PO SOLN: 1.0000 | Freq: Once | ORAL | 0 refills | Status: AC

## 2020-12-15 NOTE — Progress Notes (Signed)
Gastroenterology Pre-Procedure Review  Request Date: 12/29/20 Requesting Physician: Dr. Allen Norris  PATIENT REVIEW QUESTIONS: The patient responded to the following health history questions as indicated:    1. Are you having any GI issues? no 2. Do you have a personal history of Polyps? yes (last colonoscopy polyps removed, 5 years ago.) 3. Do you have a family history of Colon Cancer or Polyps? no 4. Diabetes Mellitus? no 5. Joint replacements in the past 12 months?no 6. Major health problems in the past 3 months?no 7. Any artificial heart valves, MVP, or defibrillator?no    MEDICATIONS & ALLERGIES:    Patient reports the following regarding taking any anticoagulation/antiplatelet therapy:   Plavix, Coumadin, Eliquis, Xarelto, Lovenox, Pradaxa, Brilinta, or Effient? no Aspirin? no  Patient confirms/reports the following medications:  Current Outpatient Medications  Medication Sig Dispense Refill   amphetamine-dextroamphetamine (ADDERALL XR) 30 MG 24 hr capsule Take 30 mg by mouth daily as needed.     cetirizine (ZYRTEC) 10 MG tablet Take 10 mg by mouth daily as needed for allergies.     losartan-hydrochlorothiazide (HYZAAR) 100-12.5 MG tablet Take 1 tablet by mouth daily.     meloxicam (MOBIC) 15 MG tablet Take 1 tablet (15 mg total) by mouth daily. 30 tablet 0   metroNIDAZOLE (METROGEL) 0.75 % gel Apply 1 application topically at bedtime.     Multiple Vitamin (MULTIVITAMIN ADULT PO) Take by mouth daily.     naproxen sodium (ALEVE) 220 MG tablet Take 220 mg by mouth 2 (two) times daily as needed.     oxyCODONE-acetaminophen (PERCOCET) 5-325 MG tablet Take 1-2 tablets by mouth every 6 (six) hours as needed for severe pain. Max 6 tabs per day 30 tablet 0   venlafaxine XR (EFFEXOR-XR) 75 MG 24 hr capsule Take 150 mg by mouth at bedtime.     zolpidem (AMBIEN) 5 MG tablet Take 5 mg by mouth at bedtime as needed for sleep.     No current facility-administered medications for this visit.     Patient confirms/reports the following allergies:  No Known Allergies  No orders of the defined types were placed in this encounter.   AUTHORIZATION INFORMATION Primary Insurance: 1D#: Group #:  Secondary Insurance: 1D#: Group #:  SCHEDULE INFORMATION: Date: 12/29/20 Time: Location: [place]

## 2020-12-15 NOTE — Telephone Encounter (Signed)
Procedure has been scheduled for 12/29/20.

## 2020-12-22 ENCOUNTER — Telehealth: Payer: Self-pay | Admitting: Gastroenterology

## 2020-12-22 ENCOUNTER — Encounter: Payer: Self-pay | Admitting: Gastroenterology

## 2020-12-22 NOTE — Telephone Encounter (Signed)
Inbound call from pt stating that her insurance does not cover her prep. She needs an alternative sent to her pharmacy. Thank you.

## 2020-12-23 MED ORDER — PEG 3350-KCL-NA BICARB-NACL 420 G PO SOLR
4000.0000 mL | Freq: Once | ORAL | 0 refills | Status: AC
Start: 2020-12-23 — End: 2020-12-23

## 2020-12-23 NOTE — Telephone Encounter (Signed)
Golytely Bowel Prep has been sent to patients pharmacy.

## 2020-12-28 NOTE — Anesthesia Preprocedure Evaluation (Addendum)
Anesthesia Evaluation  Patient identified by MRN, date of birth, ID band Patient awake    Reviewed: Allergy & Precautions, NPO status , Patient's Chart, lab work & pertinent test results  History of Anesthesia Complications Negative for: history of anesthetic complications  Airway Mallampati: III  TM Distance: >3 FB Neck ROM: Full    Dental no notable dental hx.  Lower right bridge:   Pulmonary neg pulmonary ROS, neg sleep apnea, former smoker (quit 31 years ago),    Pulmonary exam normal breath sounds clear to auscultation       Cardiovascular Exercise Tolerance: Good hypertension, Normal cardiovascular exam Rhythm:Regular Rate:Normal     Neuro/Psych PSYCHIATRIC DISORDERS (ADHD) Anxiety Depression adhd  ddd    GI/Hepatic negative GI ROS, Neg liver ROS,   Endo/Other  Morbid obesity (bmi 33)Obesity   Renal/GU negative Renal ROS  negative genitourinary   Musculoskeletal  (+) Arthritis ,   Abdominal   Peds  Hematology negative hematology ROS (+)   Anesthesia Other Findings pcp: Myrtie Soman, MD at 09/01/2020 ;  echo stress: 01/2020:  normal; Normal Stress Echocardiogram  NO VALVULAR STENOSIS NOTED ;   Reproductive/Obstetrics                            Anesthesia Physical  Anesthesia Plan  ASA: 2  Anesthesia Plan: General   Post-op Pain Management:    Induction: Intravenous  PONV Risk Score and Plan: 3 and Ondansetron, Treatment may vary due to age or medical condition and TIVA  Airway Management Planned:   Additional Equipment:   Intra-op Plan:   Post-operative Plan: Extubation in OR  Informed Consent: I have reviewed the patients History and Physical, chart, labs and discussed the procedure including the risks, benefits and alternatives for the proposed anesthesia with the patient or authorized representative who has indicated his/her understanding and acceptance.        Plan Discussed with: CRNA  Anesthesia Plan Comments:       Anesthesia Quick Evaluation

## 2020-12-29 ENCOUNTER — Ambulatory Visit: Payer: Medicare HMO | Admitting: Anesthesiology

## 2020-12-29 ENCOUNTER — Other Ambulatory Visit: Payer: Self-pay

## 2020-12-29 ENCOUNTER — Encounter: Payer: Self-pay | Admitting: Gastroenterology

## 2020-12-29 ENCOUNTER — Encounter
Admission: RE | Disposition: A | Discharge status: Home / Self Care | Payer: Self-pay | Source: Home / Self Care | Attending: Gastroenterology

## 2020-12-29 ENCOUNTER — Ambulatory Visit
Admission: RE | Admit: 2020-12-29 | Discharge: 2020-12-29 | Disposition: A | Discharge status: Home / Self Care | Payer: Medicare HMO | Attending: Gastroenterology | Admitting: Gastroenterology

## 2020-12-29 DIAGNOSIS — Z1211 Encounter for screening for malignant neoplasm of colon: Secondary | ICD-10-CM | POA: Insufficient documentation

## 2020-12-29 DIAGNOSIS — D124 Benign neoplasm of descending colon: Secondary | ICD-10-CM | POA: Diagnosis not present

## 2020-12-29 DIAGNOSIS — Z86010 Personal history of colonic polyps: Secondary | ICD-10-CM

## 2020-12-29 DIAGNOSIS — K648 Other hemorrhoids: Secondary | ICD-10-CM | POA: Insufficient documentation

## 2020-12-29 HISTORY — DX: Presence of spectacles and contact lenses: Z97.3

## 2020-12-29 HISTORY — PX: POLYPECTOMY: SHX5525

## 2020-12-29 HISTORY — PX: COLONOSCOPY WITH PROPOFOL: SHX5780

## 2020-12-29 HISTORY — DX: Hyperlipidemia, unspecified: E78.5

## 2020-12-29 SURGERY — COLONOSCOPY WITH PROPOFOL
Anesthesia: General | Site: Rectum

## 2020-12-29 MED ORDER — OXYCODONE HCL 5 MG/5ML PO SOLN: 5.0000 mg | Freq: Once | ORAL | Status: DC | PRN

## 2020-12-29 MED ORDER — LACTATED RINGERS IV SOLN: INTRAVENOUS | Status: DC

## 2020-12-29 MED ORDER — STERILE WATER FOR IRRIGATION IR SOLN
Status: DC | PRN
  Administered 2020-12-29: 1

## 2020-12-29 MED ORDER — PROPOFOL 10 MG/ML IV BOLUS
INTRAVENOUS | Status: DC | PRN
  Administered 2020-12-29 (×2): 50 mg via INTRAVENOUS
  Administered 2020-12-29: 70 mg via INTRAVENOUS
  Administered 2020-12-29: 50 mg via INTRAVENOUS

## 2020-12-29 MED ORDER — OXYCODONE HCL 5 MG PO TABS: 5.0000 mg | ORAL_TABLET | Freq: Once | ORAL | Status: DC | PRN

## 2020-12-29 MED ORDER — SODIUM CHLORIDE 0.9 % IV SOLN: INTRAVENOUS | Status: DC

## 2020-12-29 MED ORDER — LIDOCAINE HCL (CARDIAC) PF 100 MG/5ML IV SOSY
PREFILLED_SYRINGE | INTRAVENOUS | Status: DC | PRN
  Administered 2020-12-29: 40 mg via INTRAVENOUS

## 2020-12-29 SURGICAL SUPPLY — 8 items
GOWN CVR UNV OPN BCK APRN NK (MISCELLANEOUS) ×2 IMPLANT
GOWN ISOL THUMB LOOP REG UNIV (MISCELLANEOUS) ×6
KIT PRC NS LF DISP ENDO (KITS) ×1 IMPLANT
KIT PROCEDURE OLYMPUS (KITS) ×3
MANIFOLD NEPTUNE II (INSTRUMENTS) ×3 IMPLANT
SNARE COLD EXACTO (MISCELLANEOUS) ×2 IMPLANT
TRAP ETRAP POLY (MISCELLANEOUS) ×2 IMPLANT
WATER STERILE IRR 250ML POUR (IV SOLUTION) ×3 IMPLANT

## 2020-12-29 NOTE — Anesthesia Postprocedure Evaluation (Signed)
Anesthesia Post Note  Patient: Kristen Shannon  Procedure(s) Performed: COLONOSCOPY WITH BIOPSY (Rectum) POLYPECTOMY (Rectum)     Patient location during evaluation: PACU Anesthesia Type: General Level of consciousness: awake and alert Pain management: pain level controlled Vital Signs Assessment: post-procedure vital signs reviewed and stable Respiratory status: spontaneous breathing, nonlabored ventilation, respiratory function stable and patient connected to nasal cannula oxygen Cardiovascular status: blood pressure returned to baseline and stable Postop Assessment: no apparent nausea or vomiting Anesthetic complications: no   No notable events documented.  Fidel Levy

## 2020-12-29 NOTE — H&P (Addendum)
Lucilla Lame, MD [address] [place], [place] [address] Phone:[phone] Fax : [phone]  Primary Care Physician:  Myrtie Soman, MD Primary Gastroenterologist:  Dr. Allen Norris  Pre-Procedure History & Physical: HPI:  Kristen Shannon is a 70 y.o. female is here for an colonoscopy.   Past Medical History:  Diagnosis Date   ADD (attention deficit disorder)    Anxiety    DDD (degenerative disc disease), cervical    Depression    Dyspnea    Hyperlipidemia    Hypertension    Insomnia    Osteoarthritis    hands   Sleep apnea    no CPAP   Wears contact lenses     Past Surgical History:  Procedure Laterality Date   EXCISION MORTON'S NEUROMA Right 08/20/2020   Procedure: EXCISION MORTON'S NEUROMA- RIGHT 3RD;  Surgeon: [name], DPM;  Location: [facility];  Service: Podiatry;  Laterality: Right;  GENERAL WITH LOCAL    Prior to Admission medications   Medication Sig Start Date End Date Taking? Authorizing Provider  amphetamine-dextroamphetamine (ADDERALL XR) 30 MG 24 hr capsule Take 30 mg by mouth daily as needed.   Yes [provider]  cetirizine (ZYRTEC) 10 MG tablet Take 10 mg by mouth daily as needed for allergies.   Yes [provider]  losartan-hydrochlorothiazide (HYZAAR) 100-12.5 MG tablet Take 1 tablet by mouth daily.   Yes [provider]  metroNIDAZOLE (METROGEL) 0.75 % gel Apply 1 application topically at bedtime.   Yes [provider]  Multiple Vitamin (MULTIVITAMIN ADULT PO) Take by mouth daily.   Yes [provider]  naproxen sodium (ALEVE) 220 MG tablet Take 220 mg by mouth 2 (two) times daily as needed.   Yes [provider]  rosuvastatin (CRESTOR) 10 MG tablet Take 10 mg by mouth daily.   Yes [provider]  venlafaxine XR (EFFEXOR-XR) 75 MG 24 hr capsule Take 150 mg by mouth at bedtime.   Yes [provider]  zolpidem (AMBIEN) 5 MG tablet Take 5 mg by mouth at  bedtime as needed for sleep.   Yes [provider]  oxyCODONE-acetaminophen (PERCOCET) 5-325 MG tablet Take 1-2 tablets by mouth every 6 (six) hours as needed for severe pain. Max 6 tabs per day Patient not taking: Reported on 12/22/2020 08/20/20   [name], DPM    Allergies as of 12/15/2020   (No Known Allergies)    Family History  Problem Relation Age of Onset   Breast cancer Neg Hx     Social History   Socioeconomic History   Marital status: Married    Spouse name: Not on file   Number of children: Not on file   Years of education: Not on file   Highest education level: Not on file  Occupational History   Not on file  Tobacco Use   Smoking status: Never   Smokeless tobacco: Never  Vaping Use   Vaping Use: Never used  Substance and Sexual Activity   Alcohol use: Yes    Alcohol/week: 1.0 standard drink    Types: 1 Standard drinks or equivalent per week   Drug use: Not on file   Sexual activity: Not on file  Other Topics Concern   Not on file  Social History Narrative   Not on file   Social Determinants of Health   Financial Resource Strain: Not on file  Food Insecurity: Not on file  Transportation Needs: Not on file  Physical Activity: Not  on file  Stress: Not on file  Social Connections: Not on file  Intimate Partner Violence: Not on file    Review of Systems: See HPI, otherwise negative ROS  Physical Exam: BP (!) 164/76    Pulse 82    Temp 97.7 F (36.5 C) (Temporal)    Ht 5\' 6"  (1.676 m)    Wt 92.7 kg    SpO2 95%    BMI 32.99 kg/m  General:   Alert,  pleasant and cooperative in NAD Head:  Normocephalic and atraumatic. Neck:  Supple; no masses or thyromegaly. Lungs:  Clear throughout to auscultation.    Heart:  Regular rate and rhythm. Abdomen:  Soft, nontender and nondistended. Normal bowel sounds, without guarding, and without rebound.   Neurologic:  Alert and  oriented x4;  grossly normal neurologically.  Impression/Plan: Kristen Shannon is here for an colonoscopy to be performed for a history of adenomatous polyps on 2017  Risks, benefits, limitations, and alternatives regarding  colonoscopy have been reviewed with the patient.  Questions have been answered.  All parties agreeable.   Lucilla Lame, MD  12/29/2020, 7:30 AM

## 2020-12-29 NOTE — Op Note (Signed)
[facility] Gastroenterology Patient Name: Kristen Shannon Procedure Date: 12/29/2020 7:25 AM MRN: [id number] Account #: [id number] Date of Birth: 1950/03/29 Admit Type: Outpatient Age: 70 Room: [facility] OR ROOM 01 Gender: Female Note Status: Finalized Instrument Name: [id number] Procedure:             Colonoscopy Indications:           High risk colon cancer surveillance: Personal history                         of colonic polyps Providers:             Lucilla Lame MD, MD Referring MD:          Myrtie Soman, MD (Referring MD) Medicines:             Propofol per Anesthesia Complications:         No immediate complications. Procedure:             Pre-Anesthesia Assessment:                        - Prior to the procedure, a History and Physical was                         performed, and patient medications and allergies were                         reviewed. The patient's tolerance of previous                         anesthesia was also reviewed. The risks and benefits                         of the procedure and the sedation options and risks                         were discussed with the patient. All questions were                         answered, and informed consent was obtained. Prior                         Anticoagulants: The patient has taken no previous                         anticoagulant or antiplatelet agents. ASA Grade                         Assessment: II - A patient with mild systemic disease.                         After reviewing the risks and benefits, the patient                         was deemed in satisfactory condition to undergo the                         procedure.  After obtaining informed consent, the colonoscope was                         passed under direct vision. Throughout the procedure,                         the patient's blood pressure, pulse, and oxygen                         saturations were monitored  continuously. The                         Colonoscope was introduced through the anus and                         advanced to the the cecum, identified by appendiceal                         orifice and ileocecal valve. The colonoscopy was                         performed without difficulty. The patient tolerated                         the procedure well. The quality of the bowel                         preparation was good. Findings:      The perianal and digital rectal examinations were normal.      A 4 mm polyp was found in the descending colon. The polyp was sessile.       The polyp was removed with a cold snare. Resection and retrieval were       complete.      A 3 mm polyp was found in the sigmoid colon. The polyp was sessile. The       polyp was removed with a cold snare. Resection and retrieval were       complete.      Non-bleeding internal hemorrhoids were found during retroflexion. The       hemorrhoids were Grade I (internal hemorrhoids that do not prolapse). Impression:            - One 4 mm polyp in the descending colon, removed with                         a cold snare. Resected and retrieved.                        - One 3 mm polyp in the sigmoid colon, removed with a                         cold snare. Resected and retrieved.                        - Non-bleeding internal hemorrhoids. Recommendation:        - Discharge patient to home.                        - Resume previous diet.                        -  Continue present medications.                        - Await pathology results.                        - Repeat colonoscopy in 5 years for surveillance. Procedure Code(s):     --- Professional ---                        [phone], Colonoscopy, flexible; with removal of                         tumor(s), polyp(s), or other lesion(s) by snare                         technique Diagnosis Code(s):     --- Professional ---                        Z86.010, Personal history of  colonic polyps                        K63.5, Polyp of colon CPT copyright 2019 [employer]. All rights reserved. The codes documented in this report are preliminary and upon coder review may  be revised to meet current compliance requirements. Lucilla Lame MD, MD 12/29/2020 8:00:26 AM This report has been signed electronically. Number of Addenda: 0 Note Initiated On: 12/29/2020 7:25 AM Scope Withdrawal Time: 0 hours 12 minutes 52 seconds  Total Procedure Duration: 0 hours 16 minutes 32 seconds  Estimated Blood Loss:  Estimated blood loss: none.      [facility]

## 2020-12-29 NOTE — Transfer of Care (Signed)
Immediate Anesthesia Transfer of Care Note  Patient: Kristen Shannon  Procedure(s) Performed: COLONOSCOPY WITH BIOPSY (Rectum) POLYPECTOMY (Rectum)  Patient Location: PACU  Anesthesia Type: General  Level of Consciousness: awake, alert  and patient cooperative  Airway and Oxygen Therapy: Patient Spontanous Breathing and Patient connected to supplemental oxygen  Post-op Assessment: Post-op Vital signs reviewed, Patient's Cardiovascular Status Stable, Respiratory Function Stable, Patent Airway and No signs of Nausea or vomiting  Post-op Vital Signs: Reviewed and stable  Complications: No notable events documented.

## 2020-12-30 ENCOUNTER — Encounter: Payer: Self-pay | Admitting: Gastroenterology

## 2020-12-31 LAB — SURGICAL PATHOLOGY

## 2021-01-01 ENCOUNTER — Encounter: Payer: Self-pay | Admitting: Gastroenterology

## 2022-05-20 ENCOUNTER — Other Ambulatory Visit: Payer: Self-pay | Admitting: Unknown Physician Specialty

## 2022-05-20 DIAGNOSIS — Z1231 Encounter for screening mammogram for malignant neoplasm of breast: Secondary | ICD-10-CM

## 2022-09-01 ENCOUNTER — Ambulatory Visit
Admission: RE | Admit: 2022-09-01 | Discharge: 2022-09-01 | Disposition: A | Discharge status: Home / Self Care | Payer: Medicare Other | Source: Ambulatory Visit | Attending: Unknown Physician Specialty | Admitting: Unknown Physician Specialty

## 2022-09-01 DIAGNOSIS — Z1231 Encounter for screening mammogram for malignant neoplasm of breast: Secondary | ICD-10-CM | POA: Insufficient documentation

## 2023-08-10 ENCOUNTER — Other Ambulatory Visit: Payer: Self-pay | Admitting: Internal Medicine

## 2023-08-10 DIAGNOSIS — R0602 Shortness of breath: Secondary | ICD-10-CM

## 2023-08-10 DIAGNOSIS — R0789 Other chest pain: Secondary | ICD-10-CM

## 2023-08-23 ENCOUNTER — Ambulatory Visit
Admission: RE | Admit: 2023-08-23 | Discharge: 2023-08-23 | Disposition: A | Discharge status: Home / Self Care | Payer: Self-pay | Source: Ambulatory Visit | Attending: Internal Medicine | Admitting: Internal Medicine

## 2023-08-23 DIAGNOSIS — R0602 Shortness of breath: Secondary | ICD-10-CM | POA: Insufficient documentation

## 2023-08-23 DIAGNOSIS — R0789 Other chest pain: Secondary | ICD-10-CM | POA: Insufficient documentation
